# Patient Record
Sex: Female | Born: 1955 | Hispanic: No | Marital: Married | Smoking: Former smoker
Health system: Southern US, Community
[De-identification: ages and names within clinical notes are randomized; demographics above are authoritative.]

## PROBLEM LIST (undated history)

## (undated) DIAGNOSIS — I10 Essential (primary) hypertension: Secondary | ICD-10-CM

## (undated) DIAGNOSIS — I2510 Atherosclerotic heart disease of native coronary artery without angina pectoris: Secondary | ICD-10-CM

---

## 2002-11-27 ENCOUNTER — Encounter: Payer: Self-pay | Admitting: Emergency Medicine

## 2002-11-27 ENCOUNTER — Emergency Department (HOSPITAL_COMMUNITY): Admission: EM | Admit: 2002-11-27 | Discharge: 2002-11-27 | Payer: Self-pay | Admitting: Emergency Medicine

## 2004-10-20 ENCOUNTER — Emergency Department (HOSPITAL_COMMUNITY): Admission: EM | Admit: 2004-10-20 | Discharge: 2004-10-20 | Payer: Self-pay | Admitting: Emergency Medicine

## 2004-10-27 ENCOUNTER — Ambulatory Visit (HOSPITAL_COMMUNITY): Admission: RE | Admit: 2004-10-27 | Discharge: 2004-10-27 | Payer: Self-pay | Admitting: Urology

## 2004-11-06 ENCOUNTER — Ambulatory Visit (HOSPITAL_COMMUNITY): Admission: RE | Admit: 2004-11-06 | Discharge: 2004-11-06 | Payer: Self-pay | Admitting: Urology

## 2004-11-15 ENCOUNTER — Emergency Department (HOSPITAL_COMMUNITY): Admission: EM | Admit: 2004-11-15 | Discharge: 2004-11-15 | Payer: Self-pay | Admitting: Emergency Medicine

## 2008-10-16 ENCOUNTER — Emergency Department (HOSPITAL_COMMUNITY): Admission: EM | Admit: 2008-10-16 | Discharge: 2008-10-17 | Payer: Self-pay | Admitting: Emergency Medicine

## 2009-03-04 ENCOUNTER — Ambulatory Visit: Payer: Self-pay | Admitting: Cardiology

## 2009-03-04 ENCOUNTER — Inpatient Hospital Stay (HOSPITAL_COMMUNITY): Admission: EM | Admit: 2009-03-04 | Discharge: 2009-03-06 | Payer: Self-pay | Admitting: Emergency Medicine

## 2009-03-20 DIAGNOSIS — N200 Calculus of kidney: Secondary | ICD-10-CM | POA: Insufficient documentation

## 2009-03-20 DIAGNOSIS — N259 Disorder resulting from impaired renal tubular function, unspecified: Secondary | ICD-10-CM | POA: Insufficient documentation

## 2009-03-20 DIAGNOSIS — I10 Essential (primary) hypertension: Secondary | ICD-10-CM | POA: Insufficient documentation

## 2009-03-20 DIAGNOSIS — E119 Type 2 diabetes mellitus without complications: Secondary | ICD-10-CM

## 2009-03-20 DIAGNOSIS — E785 Hyperlipidemia, unspecified: Secondary | ICD-10-CM

## 2009-03-20 DIAGNOSIS — M542 Cervicalgia: Secondary | ICD-10-CM

## 2009-03-20 DIAGNOSIS — K644 Residual hemorrhoidal skin tags: Secondary | ICD-10-CM | POA: Insufficient documentation

## 2009-03-20 DIAGNOSIS — I2510 Atherosclerotic heart disease of native coronary artery without angina pectoris: Secondary | ICD-10-CM | POA: Insufficient documentation

## 2009-03-25 ENCOUNTER — Ambulatory Visit: Payer: Self-pay | Admitting: Cardiology

## 2009-03-25 ENCOUNTER — Encounter: Payer: Self-pay | Admitting: Cardiology

## 2009-04-10 ENCOUNTER — Ambulatory Visit: Payer: Self-pay | Admitting: Pulmonary Disease

## 2009-04-10 DIAGNOSIS — R05 Cough: Secondary | ICD-10-CM

## 2009-04-10 DIAGNOSIS — R0602 Shortness of breath: Secondary | ICD-10-CM | POA: Insufficient documentation

## 2009-04-12 ENCOUNTER — Telehealth (INDEPENDENT_AMBULATORY_CARE_PROVIDER_SITE_OTHER): Payer: Self-pay | Admitting: *Deleted

## 2009-04-17 DIAGNOSIS — J984 Other disorders of lung: Secondary | ICD-10-CM

## 2009-04-19 ENCOUNTER — Ambulatory Visit: Payer: Self-pay | Admitting: Cardiology

## 2011-02-26 IMAGING — CR DG CHEST 1V PORT
1 series · 1 of 1 positions shown · non-contrast
Comparison: 11/03/2004

CLINICAL DATA: Chest pain

PORTABLE CHEST - 1 VIEW

[view not recorded]
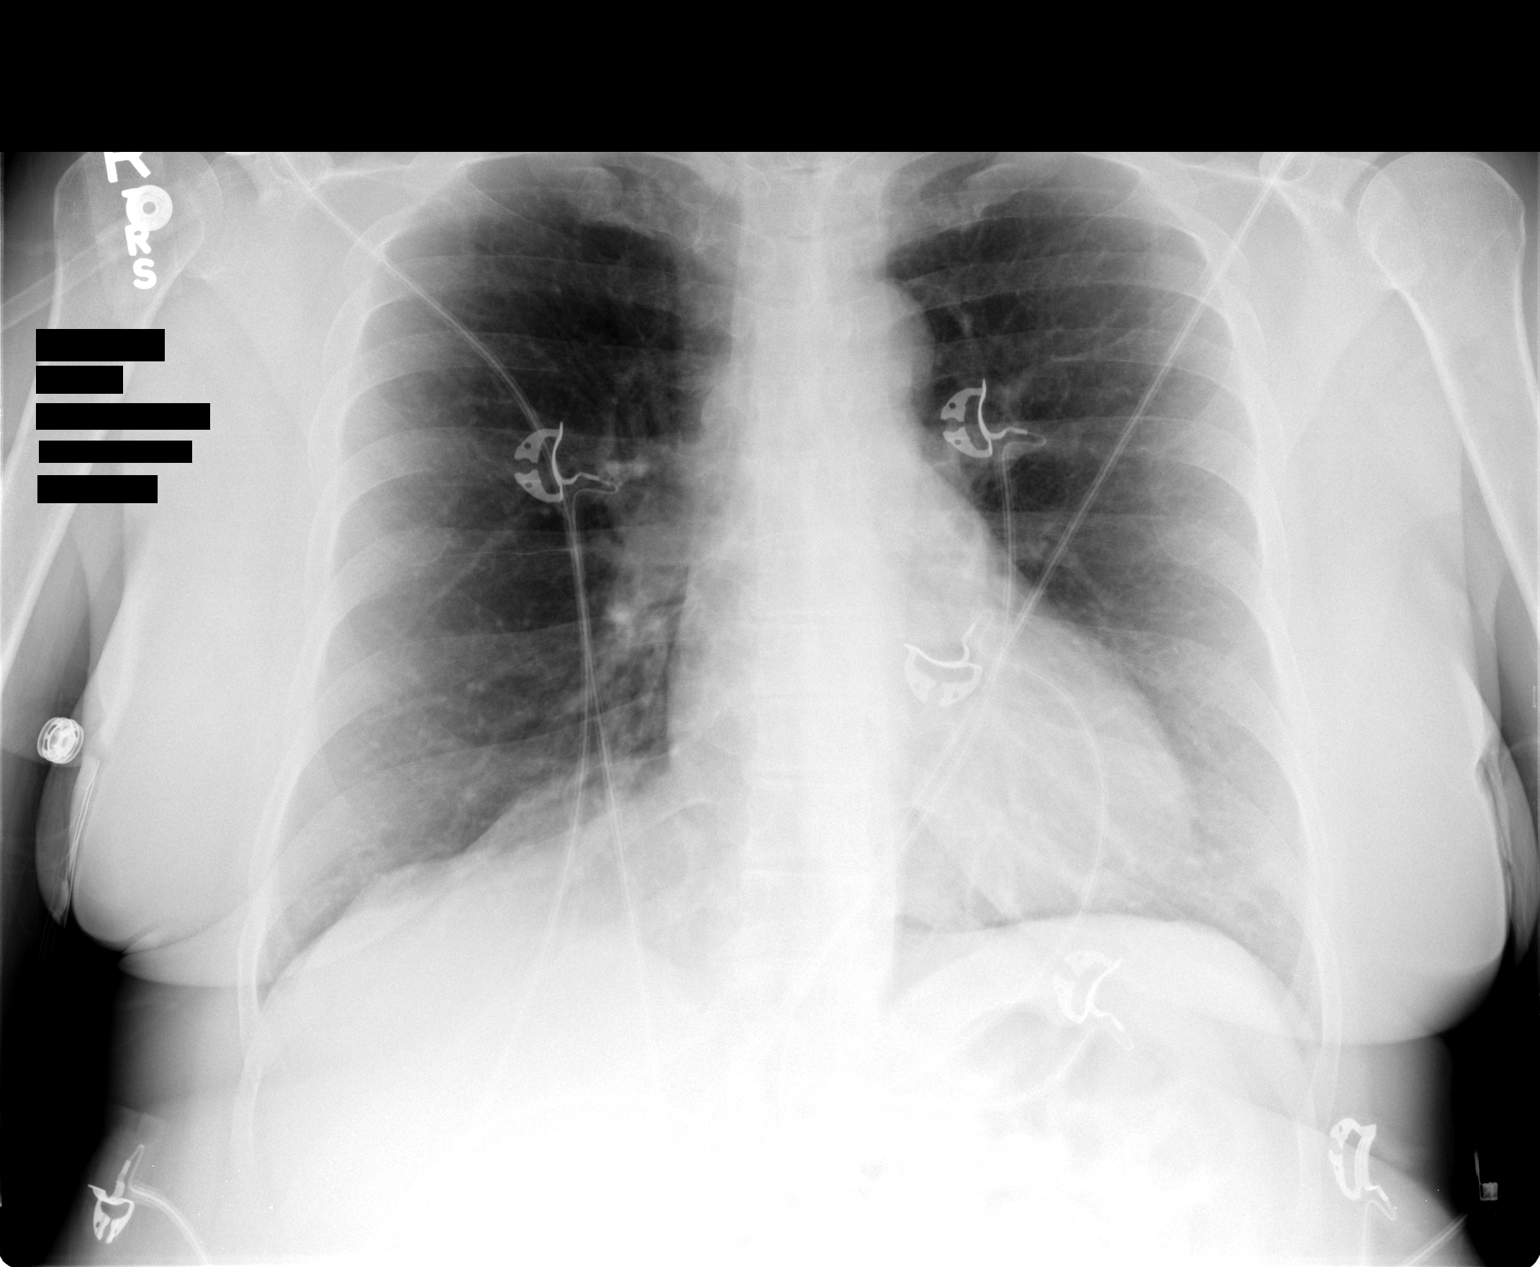

[1 of 1 positions shown; findings below may reference images not displayed]

FINDINGS: Heart and mediastinal contours normal.  Lungs clear.
Osseous structures intact in one-view.
IMPRESSION: No acute or significant findings.

## 2011-04-02 LAB — COMPREHENSIVE METABOLIC PANEL
Albumin: 3.5 g/dL (ref 3.5–5.2)
BUN: 19 mg/dL (ref 6–23)
Chloride: 103 mEq/L (ref 96–112)
Creatinine, Ser: 0.94 mg/dL (ref 0.4–1.2)
Glucose, Bld: 97 mg/dL (ref 70–99)
Total Bilirubin: 0.5 mg/dL (ref 0.3–1.2)

## 2011-04-02 LAB — DIFFERENTIAL
Basophils Absolute: 0 10*3/uL (ref 0.0–0.1)
Eosinophils Relative: 0 % (ref 0–5)
Lymphocytes Relative: 10 % — ABNORMAL LOW (ref 12–46)
Monocytes Absolute: 0.6 10*3/uL (ref 0.1–1.0)
Monocytes Relative: 3 % (ref 3–12)
Neutro Abs: 15.2 10*3/uL — ABNORMAL HIGH (ref 1.7–7.7)

## 2011-04-02 LAB — CBC
HCT: 42.2 % (ref 36.0–46.0)
HCT: 46.5 % — ABNORMAL HIGH (ref 36.0–46.0)
Hemoglobin: 15.3 g/dL — ABNORMAL HIGH (ref 12.0–15.0)
MCHC: 33 g/dL (ref 30.0–36.0)
MCV: 90.4 fL (ref 78.0–100.0)
RBC: 4.66 MIL/uL (ref 3.87–5.11)
RBC: 5.11 MIL/uL (ref 3.87–5.11)
RDW: 15.9 % — ABNORMAL HIGH (ref 11.5–15.5)
WBC: 13 10*3/uL — ABNORMAL HIGH (ref 4.0–10.5)

## 2011-04-02 LAB — CARDIAC PANEL(CRET KIN+CKTOT+MB+TROPI)
CK, MB: 2.7 ng/mL (ref 0.3–4.0)
Relative Index: INVALID (ref 0.0–2.5)
Total CK: 26 U/L (ref 7–177)
Total CK: 34 U/L (ref 7–177)
Troponin I: 0.17 ng/mL — ABNORMAL HIGH (ref 0.00–0.06)
Troponin I: 0.21 ng/mL — ABNORMAL HIGH (ref 0.00–0.06)

## 2011-04-02 LAB — PROTIME-INR
INR: 1 (ref 0.00–1.49)
Prothrombin Time: 13.7 seconds (ref 11.6–15.2)

## 2011-04-02 LAB — BASIC METABOLIC PANEL
CO2: 28 mEq/L (ref 19–32)
Calcium: 9.5 mg/dL (ref 8.4–10.5)
Chloride: 108 mEq/L (ref 96–112)
GFR calc Af Amer: >60 mL/min (ref >60)
GFR calc Af Amer: >60 mL/min (ref >60)
GFR calc non Af Amer: >60 mL/min (ref >60)
GFR calc non Af Amer: >60 mL/min (ref >60)
Glucose, Bld: 101 mg/dL — ABNORMAL HIGH (ref 70–99)
Potassium: 3.8 mEq/L (ref 3.5–5.1)
Potassium: 4.2 mEq/L (ref 3.5–5.1)
Sodium: 139 mEq/L (ref 135–145)
Sodium: 138 mEq/L (ref 135–145)

## 2011-04-02 LAB — GLUCOSE, CAPILLARY
Glucose-Capillary: 102 mg/dL — ABNORMAL HIGH (ref 70–99)
Glucose-Capillary: 108 mg/dL — ABNORMAL HIGH (ref 70–99)
Glucose-Capillary: 165 mg/dL — ABNORMAL HIGH (ref 70–99)
Glucose-Capillary: 89 mg/dL (ref 70–99)
Glucose-Capillary: 95 mg/dL (ref 70–99)

## 2011-04-02 LAB — POCT CARDIAC MARKERS
CKMB, poc: 2.9 ng/mL (ref 1.0–8.0)
Troponin i, poc: <0.05 ng/mL (ref 0.00–0.09)

## 2011-04-02 LAB — TSH: TSH: 1.766 u[IU]/mL (ref 0.350–4.500)

## 2011-04-02 LAB — CK TOTAL AND CKMB (NOT AT ARMC)
CK, MB: 3.3 ng/mL (ref 0.3–4.0)
Relative Index: INVALID (ref 0.0–2.5)
Total CK: 51 U/L (ref 7–177)

## 2011-04-02 LAB — LIPID PANEL: Triglycerides: 335 mg/dL — ABNORMAL HIGH (ref <150)

## 2011-05-05 NOTE — Discharge Summary (Signed)
Melanie Jackson, WROBLEWSKI NO.:  [id number]   MEDICAL RECORD NO.:  [id number]          PATIENT TYPE:  INP   LOCATION:  2506                         FACILITY:  [facility]   PHYSICIAN:  Luis Abed, MD, FACCDATE OF BIRTH:  June 23, 1956   DATE OF ADMISSION:  03/04/2009  DATE OF DISCHARGE:  03/06/2009                               DISCHARGE SUMMARY   PRIMARY CARDIOLOGIST:  Luis Abed, MD, [facility]   PRIMARY MEDICAL DOCTOR:  Located at [facility].   DISCHARGE DIAGNOSES:  Severe coronary artery disease (severe single  vessel disease and status post non-ST-elevation myocardial infarction,  status post successful percutaneous coronary intervention to ramus with  drug-eluting stent).   SECONDARY DIAGNOSES:  1. Tobacco abuse disorder (ongoing up to admission).  2. Diabetes mellitus.  3. Hypertension.  4. Hyperlipidemia.  5. History of nephrolithiasis.  6. History of chronic neck pain.  7. History of renal insufficiency.   ALLERGIES:  MORPHINE (causes hives).   PROCEDURES PERFORMED DURING THIS HOSPITALIZATION:  EKG.  The patient had  EKG performed on March 04, 2009 at [facility] that showed sinus  bradycardia at a rate of 59 beats per minute.  T-wave inversion in lead  V2, V4 through V6, as well as lead I and aVL.  No significant Q-waves.  No evidence of hypertrophy.  The patient had another EKG on February 04, 2009 in the [facility] that showed sinus  bradycardia with a rate of 59 beats per minute.  T-wave inversion in V2,  lead I, and aVL.  Some T-wave flattening in V3.  Otherwise, no acute ST-  T wave changes.  No significant Q-waves, normal axis.  No evidence of  hypertrophy.  No QTc prolongation in either EKG.  Chest x-ray performed  on March 04, 2009 showed no acute significant findings.  CT angio of  chest March 04, 2009.  1. No pulmonary emboli.  2. No active pulmonary disease.  (There is a 3-mm left upper lobe      nodule).  3. No other  acute or significant findings.   The patient had EKG on March 05, 2006, showed normal sinus rhythm with a  rate of 55 beats per minute, possible left atrial enlargement, normal  axis, no evidence of hypertrophy, minimal T-wave flattening in V6, T-  wave inversion in lead I and aVL.  The patient had EKG on March 05, 2009, 12 hours after prior EKG showed some nonspecific ST-T wave changes  and anterolateral leads, otherwise no significant findings compared to  prior EKG and then the patient had EKG on March 06, 2009 showed normal  sinus rhythm with a rate of 61 beats per minute.  No significant  differences from prior EKGs.   The patient had a cardiac catheterization on March 05, 2009 that showed;  1. Severe single vessel coronary artery disease at the ramus      intermedius.  2. Nonobstructive stenosis of the left anterior descending, left      circumflex, and right coronary artery.  3. Successful percutaneous coronary intervention to the ramus  intermedius.  4. Mild segmental left ventricular dysfunction.   HISTORY OF PRESENT ILLNESS:  Ms. Baranski is a 55 year old Caucasian  female with a history (described above) along with significant increase  in stress recently prior to admission, presenting with 3 days of  intermittent pain in the axilla bilaterally radiating down her side into  her chest.  The patient reports the pain has 10/10 in severity at its  worst and has been associated with diaphoresis as well as upper  extremity weakness (right upper extremity greater than left).  The  patient reports chronic shortness of breath/dyspnea on exertion  secondary to smoking and recent (last 3 months) chronic bronchitis.  The  patient reports shortness of breath recently difficult to distinguish  from baseline symptoms.  The patient denies orthopnea, PND, and lower  extremity edema.  The patient reports her symptoms initially beginning  the morning of March 01, 2009, resolving after 3  hours and did not  repeat until Sunday morning.  At that time, there was significantly  worsened and associated with diaphoresis for the first time.  The  patient had some lesser OxyContin from prior kidney infection palpated  by echo that relieved pain somewhat.  The patient continued to have  symptoms intermittently on day of admission and reported [facility] for  evaluation and EKG was completed at [facility] that showed possible  ischemia (T-wave inversion) and the patient was called and instructed to  report ED for evaluation.  At the time of eval in the ED, the patient  was asymptomatic.   HOSPITAL COURSE:  The patient was admitted above with stable vital  signs.  She remained asymptomatic on March 05, 2009; however, her  cardiac enzymes were up trending from less than 0.05-0.18 and then to  0.21.  The patient was also noted to have a significant hyperlipidemia  with total cholesterol of 213, triglycerides 335, HDL 42, LDL 104, and  VLDL 67.  The patient was also noted to have 3-mm nodule on CT angio  completed on March 04, 2009 and due to the patient's significant smoking  history, has been instructed to have a followup CT angio of the chest in  1 year per Radiology recommendation.  Due to elevated cardiac enzymes as  well as the patient's multiple risk factors and lack of alternative  explanation, the patient was scheduled for diagnostic cardiac  catheterization on March 05, 2009, (see results above).  The patient  tolerated the procedure well without significant complications.  The  patient will receive aggressive medical management going forward  including full-strength aspirin and Plavix 75 mg p.o. daily.  The  patient will also continue with simvastatin that was initiated inpatient  as 20 mg p.o. daily and will be titrated as tolerated at followup  visits.  Note, the patient will began metoprolol 12.5 mg p.o. b.i.d. due  to NSTEMI and poorly, moderately controlled  hypertension.  The patient's  enalapril dose which was uncertain on admission will be 10 mg p.o.  daily, moving forward and beta-blocker and ACE inhibitor will be  titrated as needed at followup visit.  The patient reports her insurance  is not covering Plavix or Chantix, which she will need for smoking  cessation per smoking cessation counselor recommendations.  Therefore,  case management was consulted in both Plavix and Chantix.  Assistance  paperwork has been completed.   At the time of discharge the patient's followup instructions, postcath  instructions, new medication list were given to her  both in oral and  written form and when last seen, the patient has no questions or  concerns that had not been addressed.   Most recent vital signs on discharge were temperature 97.8 degrees  Fahrenheit, BP 137/91, pulse 77, respiration rate 15, O2 saturation 98%  on room air.   DISCHARGE LABS:  WBC 13.0, HGB 14.1, HCT 42.2, PLT count 183.  Sodium  139, potassium 3.8, chloride 108, CO2 26, BUN 19, creatinine 0.80,  glucose 75, calcium 8.8.  Lipid studies, see hospital course.  Last  troponin down trending at 0.17 from 0.21, total bilirubin 0.5, alkaline  phosphatase 77, AST 14, ALT 21, total protein 5.7, albumin 3.5.  TSH  1.766.   FOLLOWUP PLANS AND APPOINTMENTS:  The patient has followup appointment  at [facility] for a lab draw on March 21, 2009.  Time of day  prior to close at 5 p.m.  The patient has a followup appointment with  primary cardiologist, Dr. Myrtis Ser on March 25, 2009 at 3 p.m.   DISCHARGE MEDICATIONS:  1. Trazodone 200 mg p.o. nightly.  2. Metformin 500 mg p.o. b.i.d. (to be restarted on March 07, 2009 in      the a.m.)  3. Enalapril 10 mg p.o. daily.  4. Black cohosh 2 tablets p.o. daily.  5. Vitamin C 2 tablets p.o. daily.  6. Cranberry pills 2 tabs p.o. daily.  7. Bone health supplement 2 tablets p.o. daily.  8. Metoprolol 12.5 mg p.o. b.i.d.  9. Enteric-coated  aspirin 325 mg p.o. daily.  10.Plavix 75 mg p.o. daily.  11.Chantix, take as directed.  12.Simvastatin 20 mg p.o. daily.  13.Nitroglycerin 0.4 mg sublingual p.r.n. chest pain.   Duration of discharge encounter including physician time was 45 minutes.      Jarrett Ables, [facility]      Luis Abed, MD, [facility]  Electronically Signed    MS/MEDQ  D:  03/06/2009  T:  03/07/2009  Job:  [id number]   cc:   Veverly Fells. Excell Seltzer, MD

## 2011-05-05 NOTE — Cardiovascular Report (Signed)
NAMEARTHELIA, CALLICOTT                ACCOUNT NO.:  [id number]   MEDICAL RECORD NO.:  [id number]          PATIENT TYPE:  INP   LOCATION:  2506                         FACILITY:  [facility]   PHYSICIAN:  Veverly Fells. Excell Seltzer, MD  DATE OF BIRTH:  01-03-1956   DATE OF PROCEDURE:  03/05/2009  DATE OF DISCHARGE:                            CARDIAC CATHETERIZATION   PROCEDURE:  Left heart catheterization, selective coronary angiography,  left ventricular angiography, percutaneous transluminal coronary  angioplasty and stenting of the ramus intermedius and Perclose of the  right femoral artery.   INDICATIONS:  Ms. Melanie Jackson is a 55 year old woman with multiple cardiac  risk factors who presented with an acute coronary syndrome.  She had  positive cardiac enzymes and ruled in for a non-ST-elevation MI.  She  was referred for cardiac cath.   Risks and indications of procedure were reviewed with the patient.  Informed consent was obtained.  The right groin was prepped, draped, and  anesthetized with 1% lidocaine.  Using modified Seldinger technique, a 5-  French sheath was placed in the right femoral artery.  The standard 5-  J[place]ica Judkins catheters were used for coronary angiography and left  ventriculography.  Following the diagnostic procedure, I elected to  intervene on a critical stenosis in the ramus intermedius.  The sheath  was up-sized to a 6-French.  Angiomax was used for anticoagulation.  The  patient had been preloaded with 300 mg of Plavix earlier in the day.  A  6-French XB 3.5-cm guide catheter was used.  The ramus was wired with a  cougar guidewire without difficulty.  The vessel was predilated with a  2.0 x 6 mm Sprinter balloon which was taken to 6 atmospheres on multiple  inflations.  I elected to treat the vessel with a 2.5 x 14 mm Endeavor  drug-eluting stent which was carefully positioned and deployed at 9  atmospheres.  The stent appeared well expanded.  The vessel increased  markedly in size after stenting.  It was clearly underfilled prior to  stenting.  The stent was then postdilated with a 2.5 x 12 mm Sprinter [place]  balloon which was taken to 12 atmospheres and then 16 atmospheres.  At  the completion of the procedure, there was a well-expanded stent with a  step-up into the stent and a step-down off the distal end.  There was  TIMI III flow in the vessel and an excellent angiographic result.  Femoral angiography demonstrated a high stick just above the inferior  epigastric artery.  I elected to Perclose the vessel because I thought  that was the safest method of hemostasis.   FINDINGS:  Aortic pressure 148/83 with a mean of 107, left ventricular  pressure 149/7.   CORONARY ANGIOGRAPHY:  Left mainstem:  Widely patent.  No significant  stenosis.   LAD:  The LAD is a moderate-sized vessel that courses down and reaches  the LV apex.  The LAD has mild diffuse stenosis throughout the  midportion up to 30%.  There are 3 very small diagonal branches and 2  small septal perforator branches.  The ramus intermedius is a moderate-sized vessel.  There is a 95%  stenosis in the proximal portion of the vessel.  This stenosis occurs  just beyond the branch point of the vessel, but effects the dominant  branch of the intermediate.  The remaining portions of the intermediate  branch are relatively small with no significant stenosis.   Left circumflex:  The left circumflex is widely patent.  There is mild  nonobstructive stenosis in the midportion.  The circumflex is codominant  with the right coronary artery.   RCA:  The RCA is small.  It is diffusely diseased, but only  nonobstructive stenosis is present.  In the midportion, there is 30-40%  stenosis.  It supplies a small PDA and a small posterolateral branch.   Left ventriculography shows an akinetic segment in a focal region of the  anterolateral wall.  The apex contracts normally.  The inferior wall  also  contracts normally.  The estimated LVEF is 45%.  There is no  significant mitral regurgitation.   ASSESSMENT:  1. Severe single-vessel coronary artery disease of the ramus      intermedius.  2. Nonobstructive stenosis of the left anterior descending, left      circumflex, and right coronary artery.  3. Successful percutaneous coronary intervention to the ramus      intermedius.  4. Mild segmental left ventricular dysfunction.   PLAN:  Recommend dual-antiplatelet therapy with aspirin and Plavix  greater than or equal to 12 months.  Aggressive medical therapy  following the patient's acute coronary syndrome.      Veverly Fells. Excell Seltzer, MD  Electronically Signed     MDC/MEDQ  D:  03/05/2009  T:  03/06/2009  Job:  [id number]   cc:   Luis Abed, MD, [facility]

## 2011-05-05 NOTE — H&P (Signed)
NAMEELVERNA, CAFFEE NO.:  [id number]   MEDICAL RECORD NO.:  [id number]          PATIENT TYPE:  INP   LOCATION:  3709                         FACILITY:  [facility]   PHYSICIAN:  Luis Abed, MD, FACCDATE OF BIRTH:  1956-01-19   DATE OF ADMISSION:  03/04/2009  DATE OF DISCHARGE:                              HISTORY & PHYSICAL   PRIMARY CARE Enriqueta Augusta:  [facility].   The patient is new to Cardiology; going forward, her cardiologist will  be Dr. Myrtis Ser.   CHIEF COMPLAINT:  Chest pain/EKG changes.   HISTORY OF PRESENT ILLNESS:  This is a 55 year old female with a history  of hypertension, hyperlipidemia, diabetes mellitus type 2, ongoing  tobacco disorder, and recent significant increase in stress, presenting  with 3 days of intermittent pain in the axilla bilaterally, radiating  down her sides and into her chest.  The patient reports the pain is  10/10 in severity at its worse and has been associated with diaphoresis  and upper extremity weakness, right greater than left during certain  episode of symptoms.  The patient reports chronic shortness of breath  secondary to smoking and chronic bronchitis.  The patient has been short  of breath at that time with the symptoms, however, difficult to  determine if a change from baseline.  The patient also reports chronic  dyspnea on exertion with no change recently.  Denies orthopnea, PND, and  lower extremity edema.  The patient states that her symptoms initially  began on Friday morning, which would be March 01, 2009, resolved after 3  hours, and did not repeat until Sunday morning at which time, they were  significantly worsened and associated with diaphoresis.  The pain was  relieved somewhat with OxyContin that the patient had left over from a  kidney infection she had several months ago.  The patient continued to  have symptoms today and reported to [facility] for evaluation.  The  patient had an EKG that showed some  T-wave inversion that was analyzed  after she had left and she was called and told to present to the  emergency department for evaluation.  At the time of her eval in the ED,  the patient was asymptomatic.   PAST MEDICAL HISTORY:  1. Nephrolithiasis.  2. History of renal insufficiency.  3. Tobacco abuse disorder (ongoing).  4. Chronic neck pain.  5. Diabetes mellitus.  6. Hypertension.  7. Hyperlipidemia.  8. External hemorrhoids.   SOCIAL HISTORY:  The patient lives in [place] with her husband.  She  works part-time, Education officer, environmental houses.  She has a 60 pack-year smoking  history that is ongoing.  She does not drink any alcohol nor does she  take any illicit drugs.  She takes black cohosh, vitamin C, and  cranberry pills as herbal medications.  She has a regular diet and does  no regular exercise.   FAMILY HISTORY:  The patient was raised by her grandmother due to both  her parents dying, when she was extremely young, in a motor vehicle  accident.  She does not know of their  medical history.  Her grandmother  died at age 37 with coronary artery disease and diabetes mellitus.  She  has one brother, who is living, with no history of coronary artery  disease.   REVIEW OF SYSTEMS:  As described in HPI.  Also chronic cough, dyspnea on  exertion, amenorrhea for the last 4 years (the patient is  postmenopausal, although she reports recent hot flashes over the last  several days occasionally associated with symptomatology described in  HPI).  She has had significant increase in stress over the last several  weeks.  She has bright red blood per rectum occasionally due to  hemorrhoids.  Otherwise, all other systems were reviewed and were  negative.   ALLERGIES:  MORPHINE (causes hives and itching).   MEDICATIONS:  1. Metformin 500 mg p.o. b.i.d.  2. Enalapril question dose.   PHYSICAL EXAMINATION:  VITAL SIGNS:  Temp is 98 degrees Fahrenheit, BP  145/81, pulse 66, respiration rate  17, O2 saturation was 97% on 2 L by  nasal cannula.  GENERAL:  On exam, the patient was alert and oriented x3 in no apparent  distress and able to speak in full sentences, as well as moved easily  without respiratory distress.  HEENT:  Head was normocephalic, atraumatic.  Pupils equal, round, and  reactive to light.  Extraocular muscles were intact.  The nares were  patent without discharge.  Dentition was fair.  Oropharynx was without  erythema or exudates.  NECK:  Supple without lymphadenopathy.  No JVD.  No thyromegaly.  No  bruits.  HEART:  Regular with audible S1, S2.  No clicks, rubs, murmurs, or  gallops.  Pulses were 2+ and equal in both upper and lower extremities  bilaterally.  LUNGS:  Lungs were clear to auscultation bilaterally.  Decreased breath  sounds at the bases.  SKIN:  No rashes, lesions, or petechiae.  ABDOMEN:  Soft, nontender, and nondistended.  Normal abdominal bowel  sounds.  No rebound or guarding.  No hepatosplenomegaly and no  pulsations.  The patient is overweight.  EXTREMITIES:  No clubbing, cyanosis, or edema.  MUSCULOSKELETAL:  Reveals no joint deformity or effusions.  No spinal or  CVA tenderness.  NEURO:  Cranial nerves II-XII are grossly intact.  Strength is 5/5 in  all extremities and axial groups.  Normal sensation throughout and  normal cerebellar function.   RADIOLOGY:  The patient had chest x-ray that showed no acute or  significant findings.  The patient had a CT of the chest, which showed  no evidence of pulmonary embolus.  It showed a tiny lung nodule.  No  need for followup unless the patient is high risk for lung cancer, in  which case followup in 1 year.  The patient had an EKG in the ED that  showed sinus bradycardia with a rate of 59 beats per minute; T-wave  inversion in V2, 1, and aVL; T-wave flattening in V3; otherwise, no  acute ST-T wave changes.  No significant Q wave and normal axis, no  evidence of hypertrophy.  PR 132, QRS 88,  QTc 428.  The patient had an  EKG earlier in the day that showed sinus bradycardia with a rate of 59  beats per minute.  It shows T-wave inversion in lead V2 and V4 through  V6 as well as in 1 and aVL.  No significant Q-wave.  No evidence of  hypertrophy, normal axis, PR 106, QRS 90, and QTc 457.   LABORATORY DATA:  WBC 17.7, HGB 15.3, HCT 46.55, PLT count 228.  Sodium  138, potassium 4.2, chloride 104, CO2 28, BUN 19, creatinine 0.90,  glucose 101.  First set of cardiac enzymes were negative.   ASSESSMENT AND PLAN:  This is a 55 year old female with a history of  diabetes mellitus, hypertension, hyperlipidemia, ongoing tobacco abuse  disorder, and a recent increase in stress presenting with atypical chest  pain.  1. Atypical chest pain.  There is little in the way of concrete      objective data other than some transient EKG changes that may or      may not be significant.  However, the patient has multiple risk      factors including a coronary artery disease equivalent (diabetes      mellitus), was admitted, rule out with cycle cardiac enzymes x3,      completed inpatient stress Myoview on treadmill or cardiac      catheterization if her enzymes were positive.  We will check her      lipids in the a.m., check a CMET in the a.m., check a CBC in the      a.m., and check an EKG in the a.m.  She will be started on a low-      dose beta-blocker, metoprolol 12.5 mg p.o. b.i.d. as well as low-      dose statin, simvastatin 20 mg p.o. daily.  She will also take      aspirin 325 mg p.o. daily and continue on her enalapril.  We will      start her at a low dose as we are unsure of her dose at 5 mg p.o.      daily.  At this time, we will not give her heparin and deep venous      thrombosis prophylaxis will be SCDs.  2. Hypertension.  See plan in number 1 for addition of beta-blocker      and continued home medications.  3. Hyperlipidemia.  See plan number 1.  Check lipids.  Start statin.  4.  Deep venous thrombosis prophylaxis, SCDs.  5. Tiny lung nodule.  We will inform the patient that due to her high      risk for lung cancer, she should have repeat CT of the chest in 1      year.      Jarrett Ables, [facility]      Luis Abed, MD, [facility]  Electronically Signed    MS/MEDQ  D:  03/04/2009  T:  03/05/2009  Job:  [id number]

## 2011-05-05 NOTE — Assessment & Plan Note (Signed)
[facility]                            CARDIOLOGY OFFICE NOTE   NAME:Kriz, OMUNIQUE PEDERSON                       MRN:          [id number]  DATE:03/25/2009                            DOB:          04-07-1956    HISTORY OF PRESENT ILLNESS:  Melanie Jackson is seen for Cardiology follow-  up today.  A copy of her history and physical and discharge summary from  [facility] will accompany these records to Dr. Gaynelle Adu.  The  admission was on March 04, 2009, and the discharge was on March 06, 2009.   The patient presented with some discomfort in her chest.  She had T-wave  inversions, and she was referred to the hospital.  We were quite  concerned about her status and decision was made to proceed with cardiac  catheterization.  This was done.  The patient had a tight lesion in her  ramus.  This was treated with a drug-eluting stent.  She felt much  better.  She had mild left ventricular dysfunction.   The patient was discharged.  Plans were made for careful follow-up.  It  had been noted in the hospital that she had a 3 mm lung nodule in the  left upper lobe.  A formal recommendation was for follow-up CT of the  chest in one year.   The patient returns today post hospitalization.  She has what sounds  like an upper respiratory infection.  She is not febrile.  She says that  her ear is stopped up.  She felt that this may be due to some of the  medications that were started.  She stopped her simvastatin and  metoprolol.  In addition, she stopped her Plavix.  I immediately urged  her to go back on the Plavix as she has a drug-eluting stent in place.   PAST MEDICAL HISTORY:   ALLERGIES:  MORPHINE.   MEDICATIONS:  At this time, she has a long medication list, but it is  not clear to me which of these medicines.  She in fact is taking.  She  did tell me that she stopped her metoprolol, simvastatin and Plavix.   OTHER MEDICAL PROBLEMS:  See the list  below.   REVIEW OF SYSTEMS:  Today, she has a cough.  She has had some difficulty  with her ear.  She is not having chest pain.  There are no skin rashes.  She has not had any visual changes.  She does have a cough.  She has no  GI or GU symptoms.  There are no major musculoskeletal problems.   PHYSICAL EXAMINATION:  VITAL SIGNS:  Blood pressure is 110/70.  Pulse is  68.  GENERAL:  The patient is here with a friend.  The patient is oriented to  person, time and place.  Affect is normal.  HEENT:  Reveals no xanthelasma.  She has cotton in her right ear.  I did  not examine her ear, and I explained to her that this is not part of my  cardiac evaluation.  I urged her to go  back to be seen in primary care  for her general medical problems.  LUNGS:  Reveal some scattered rhonchi.  CARDIAC:  Exam reveals S1-S2.  There are no clicks or significant  murmurs.  ABDOMEN:  Soft.  She has no significant peripheral edema.   STUDIES:  EKG today revealed reveals some nonspecific ST-T wave changes  with some lateral T-wave inversions.   PROBLEMS:  1. Recent non-ST elevation MI.  2. 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  3. Cigarette abuse.  She says she has not smoked in 3 weeks.  4. Hypertension, stable.  5  History of renal stones.  1. History of renal insufficiency, although her renal function was      quite good in the hospital.  2. A 3 mm lung nodule in the left upper lobe of her lung seen by chest      CT.  Recommendation is for her to have this followed up in one      year.  The patient is completely aware that this needs to be      followed in one year.  She says that she will have this followed by      her doctors in [place].  3. Probable upper respiratory infection with possible sinus infection.      I have urged the patient to be seen again by primary care to obtain      the treatment for these problems.   It is extremely  important that the patient restart to take her aspirin  and Plavix again.  She thinks that this may be playing a role in her  nasal and lung symptoms.  I do not think so.  I explained to her and her  friend that it is dangerous for her to be off Plavix in this setting  having recently received a drug-eluting stent.     Luis Abed, MD, [facility]  Electronically Signed    JDK/MedQ  DD: 03/25/2009  DT: 03/25/2009  Job #: [id number]   cc:   Dr. Rosalio Macadamia

## 2011-05-08 NOTE — Op Note (Signed)
NAMEMARLYCE, Melanie Jackson                ACCOUNT NO.:  [id number]   MEDICAL RECORD NO.:  [id number]          PATIENT TYPE:  AMB   LOCATION:  DAY                          FACILITY:  [facility]   PHYSICIAN:  Sigmund I. Patsi Sears, M.D.DATE OF BIRTH:  January 08, 1956   DATE OF PROCEDURE:  10/27/2004  DATE OF DISCHARGE:                                 OPERATIVE REPORT   PREOPERATIVE DIAGNOSIS:  Left ureteropelvic junction stone.   POSTOPERATIVE DIAGNOSIS:  Left ureteropelvic junction stone.   OPERATION:  1.  Cystourethroscopy.  2.  Left retrograde pyelogram.  3.  Left double-J catheter.   SURGEON:  Sigmund I. Patsi Sears, M.D.   ANESTHESIA:  General LMA.   PREPARATION:  After appropriate preanesthesia, the patient is brought to the  operating room and placed on the operating table in the dorsal supine  position where general LMA anesthesia was introduced.  The patient was then  re-placed in dorsal lithotomy position where the pubis was prepped with  Betadine solution and draped in usual fashion.   HISTORY:  Melanie Jackson is a 55 year old female, recently moved from [place],  with known left UPJ stone.  She presented to [facility] Emergency Room with  a left renal colic, with CT scan showing 10 mm left UPJ stone.  She was  counseled about lithotripsy and presents now for left cystoscopy and left  retrograde pyelogram to rule out UPJ obstruction and double-J catheter  placement.  She is a known type 2 diabetic and is well controlled and also  has a blue mark on her left flank placed preoperatively by myself.   DESCRIPTION OF PROCEDURE:  Following general anesthesia, the patient was  placed in dorsal lithotomy position where the pubis was prepped with  Betadine solution and draped in usual fashion.   Cystourethroscopy revealed normal-appearing bladder with no evidence of  bladder stone, tumor, or diverticular formation.  Left retrograde pyelogram  showed a normal-appearing ureter.  An abnormal  area was found at the UPJ,  but I could not definitely see stone material on the fluoroscopic unit.  Retrograde pyelogram was performed, and there appeared to be an abnormal  area in the UPJ.  But again, no obvious stone could identified.  A guidewire  was placed and had great difficulty passing through the granular material.  The ureteral stent was placed, and I could feel  granular stone material at the UPJ.  Eventually, a Glidewire was passed into  the renal pelvis and coiled.  Over this, a 6 French x 24 cm double-J  catheter was passed in the renal pelvis and the bladder.  The patient  tolerated the procedure well, was given IV Toradol, awakened, and taken to  recovery room in good condition.      SIT/MEDQ  D:  10/27/2004  T:  10/27/2004  Job:  [id number]

## 2011-09-22 LAB — COMPREHENSIVE METABOLIC PANEL
ALT: 20
Alkaline Phosphatase: 93
CO2: 27
Chloride: 104
GFR calc non Af Amer: >60
Glucose, Bld: 145 — ABNORMAL HIGH
Potassium: 4.1
Sodium: 139
Total Bilirubin: 0.4

## 2011-09-22 LAB — URINE MICROSCOPIC-ADD ON

## 2011-09-22 LAB — URINALYSIS, ROUTINE W REFLEX MICROSCOPIC
Glucose, UA: NEGATIVE
Ketones, ur: NEGATIVE
Leukocytes, UA: NEGATIVE
Protein, ur: 100 — AB

## 2011-09-22 LAB — DIFFERENTIAL
Basophils Relative: 0
Eosinophils Absolute: 0.4
Neutrophils Relative %: 76

## 2011-09-22 LAB — CBC
Hemoglobin: 14.3
MCHC: 33.1
RBC: 4.8

## 2011-09-22 LAB — LIPASE, BLOOD: Lipase: 1528 — ABNORMAL HIGH

## 2012-04-30 ENCOUNTER — Emergency Department (HOSPITAL_COMMUNITY)
Admission: EM | Admit: 2012-04-30 | Discharge: 2012-04-30 | Disposition: A | Discharge status: Home / Self Care | Payer: Self-pay | Attending: Emergency Medicine | Admitting: Emergency Medicine

## 2012-04-30 ENCOUNTER — Encounter (HOSPITAL_COMMUNITY): Payer: Self-pay | Admitting: Emergency Medicine

## 2012-04-30 ENCOUNTER — Emergency Department (HOSPITAL_COMMUNITY): Payer: Self-pay

## 2012-04-30 DIAGNOSIS — Y838 Other surgical procedures as the cause of abnormal reaction of the patient, or of later complication, without mention of misadventure at the time of the procedure: Secondary | ICD-10-CM | POA: Insufficient documentation

## 2012-04-30 DIAGNOSIS — R5383 Other fatigue: Secondary | ICD-10-CM | POA: Insufficient documentation

## 2012-04-30 DIAGNOSIS — R5381 Other malaise: Secondary | ICD-10-CM | POA: Insufficient documentation

## 2012-04-30 DIAGNOSIS — R509 Fever, unspecified: Secondary | ICD-10-CM | POA: Insufficient documentation

## 2012-04-30 DIAGNOSIS — R000 Tachycardia, unspecified: Secondary | ICD-10-CM | POA: Insufficient documentation

## 2012-04-30 DIAGNOSIS — T8140XA Infection following a procedure, unspecified, initial encounter: Secondary | ICD-10-CM | POA: Insufficient documentation

## 2012-04-30 DIAGNOSIS — E119 Type 2 diabetes mellitus without complications: Secondary | ICD-10-CM | POA: Insufficient documentation

## 2012-04-30 DIAGNOSIS — T8130XA Disruption of wound, unspecified, initial encounter: Secondary | ICD-10-CM | POA: Insufficient documentation

## 2012-04-30 DIAGNOSIS — I2510 Atherosclerotic heart disease of native coronary artery without angina pectoris: Secondary | ICD-10-CM | POA: Insufficient documentation

## 2012-04-30 DIAGNOSIS — Z7982 Long term (current) use of aspirin: Secondary | ICD-10-CM | POA: Insufficient documentation

## 2012-04-30 DIAGNOSIS — Z79899 Other long term (current) drug therapy: Secondary | ICD-10-CM | POA: Insufficient documentation

## 2012-04-30 DIAGNOSIS — T8149XA Infection following a procedure, other surgical site, initial encounter: Secondary | ICD-10-CM

## 2012-04-30 HISTORY — DX: Atherosclerotic heart disease of native coronary artery without angina pectoris: I25.10

## 2012-04-30 LAB — CBC
MCHC: 32.8 g/dL (ref 30.0–36.0)
Platelets: 271 10*3/uL (ref 150–400)
RDW: 14.3 % (ref 11.5–15.5)
WBC: 14.5 10*3/uL — ABNORMAL HIGH (ref 4.0–10.5)

## 2012-04-30 LAB — COMPREHENSIVE METABOLIC PANEL
ALT: 12 U/L (ref 0–35)
AST: 17 U/L (ref 0–37)
Albumin: 3.9 g/dL (ref 3.5–5.2)
CO2: 25 mEq/L (ref 19–32)
Calcium: 9.5 mg/dL (ref 8.4–10.5)
Chloride: 100 mEq/L (ref 96–112)
GFR calc non Af Amer: 75 mL/min — ABNORMAL LOW (ref >90)
Sodium: 137 mEq/L (ref 135–145)

## 2012-04-30 LAB — URINALYSIS, ROUTINE W REFLEX MICROSCOPIC
Glucose, UA: NEGATIVE mg/dL
Hgb urine dipstick: NEGATIVE
Leukocytes, UA: NEGATIVE
Protein, ur: NEGATIVE mg/dL
pH: 7.5 (ref 5.0–8.0)

## 2012-04-30 LAB — DIFFERENTIAL
Basophils Absolute: 0 10*3/uL (ref 0.0–0.1)
Basophils Relative: 0 % (ref 0–1)
Eosinophils Relative: 1 % (ref 0–5)
Lymphocytes Relative: 17 % (ref 12–46)
Neutro Abs: 10.9 10*3/uL — ABNORMAL HIGH (ref 1.7–7.7)

## 2012-04-30 MED ORDER — SULFAMETHOXAZOLE-TRIMETHOPRIM 800-160 MG PO TABS: 1.0000 | ORAL_TABLET | Freq: Two times a day (BID) | ORAL | Status: AC

## 2012-04-30 MED ORDER — IOHEXOL 300 MG/ML  SOLN
80.0000 mL | Freq: Once | INTRAMUSCULAR | Status: AC | PRN
  Administered 2012-04-30: 80 mL via INTRAVENOUS

## 2012-04-30 MED ORDER — SODIUM CHLORIDE 0.9 % IV BOLUS (SEPSIS)
2000.0000 mL | Freq: Once | INTRAVENOUS | Status: AC
  Administered 2012-04-30: 1000 mL via INTRAVENOUS

## 2012-04-30 MED ORDER — OXYCODONE-ACETAMINOPHEN 5-325 MG PO TABS: 2.0000 | ORAL_TABLET | ORAL | Status: AC | PRN

## 2012-04-30 MED ORDER — VANCOMYCIN HCL IN DEXTROSE 1-5 GM/200ML-% IV SOLN
1000.0000 mg | Freq: Once | INTRAVENOUS | Status: AC
  Administered 2012-04-30: 1000 mg via INTRAVENOUS
  Filled 2012-04-30: qty 200

## 2012-04-30 MED ORDER — AMOXICILLIN-POT CLAVULANATE 875-125 MG PO TABS: 1.0000 | ORAL_TABLET | Freq: Two times a day (BID) | ORAL | Status: DC

## 2012-04-30 MED ORDER — SODIUM CHLORIDE 0.9 % IV SOLN
INTRAVENOUS | Status: DC
  Administered 2012-04-30: 1000 mL via INTRAVENOUS

## 2012-04-30 MED ORDER — METRONIDAZOLE IN NACL 5-0.79 MG/ML-% IV SOLN
500.0000 mg | Freq: Once | INTRAVENOUS | Status: AC
  Administered 2012-04-30: 500 mg via INTRAVENOUS
  Filled 2012-04-30: qty 100

## 2012-04-30 MED ORDER — PIPERACILLIN-TAZOBACTAM 3.375 G IVPB 30 MIN
3.3750 g | Freq: Once | INTRAVENOUS | Status: AC
  Administered 2012-04-30: 3.375 g via INTRAVENOUS
  Filled 2012-04-30: qty 50

## 2012-04-30 MED ORDER — AMOXICILLIN-POT CLAVULANATE 875-125 MG PO TABS: 1.0000 | ORAL_TABLET | Freq: Two times a day (BID) | ORAL | Status: AC

## 2012-04-30 MED ORDER — IOHEXOL 300 MG/ML  SOLN
20.0000 mL | INTRAMUSCULAR | Status: AC
  Administered 2012-04-30 (×2): 20 mL via ORAL

## 2012-04-30 NOTE — ED Notes (Signed)
Patient tolerating fluids and zosyn with NAD. Patient denies any pain. Family at bedside.

## 2012-04-30 NOTE — Discharge Instructions (Signed)
Wound Dehiscence Wound dehiscence is when a surgical cut (incision) opens up. It usually happens 7 to 10 days after surgery. You may have pain, a fever, or have more fluid coming from the cut. It should be treated early. HOME CARE  Only take medicines as told by your doctor.   Take your medicines (antibiotics) as told. Finish them even if you start to feel better.   Wash your wound with warm, soapy water 2 times a day, or as told. Pat the wound dry. Do not rub the wound.   Change bandages (dressings) as often as told. Wash your hands before and after changing bandages. Apply bandages as told.   Take showers. Do not soak the wound, bathe, or swim until your wound is healed.   Avoid exercises that make you sweat.   Use medicines that stop itching as told by your doctor. The wound may itch as it heals. Do not pick or scratch at the wound.   Do not lift more than 10 pounds (4.5 kilograms) until the wound is healed, or as told by your doctor.   Keep all doctor visits as told.  GET HELP RIGHT AWAY IF:   You have more puffiness (swelling) or redness around the wound.   You have more pain in the wound.   You have more yellowish white fluid (pus) coming from the wound.   More of the wound breaks open.   You have a fever in 2 days or later.  You develop dizziness, confusion, chest pain, shortness of breath, vomiting, diffuse abdominal pain, or other concerns. MAKE SURE YOU:   Understand these instructions.   Will watch your condition.   Will get help right away if you are not doing well or get worse.  Document Released: 11/25/2009 Document Revised: 11/26/2011 Document Reviewed: 04/12/2011 [facility] Patient Information 2012 [place], [place].

## 2012-04-30 NOTE — ED Notes (Signed)
Pt denies any further questions upon discharge. 
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
## 2012-04-30 NOTE — ED Provider Notes (Signed)
History   This chart was scribed for Hurman Horn, MD scribed by Magnus Sinning. The patient was seen in room STRE7/STRE7 seen at 14:06  CSN: [id number]  Arrival date & time 04/30/12  1156   None     Chief Complaint  Patient presents with  . Fever    (Consider location/radiation/quality/duration/timing/severity/associated sxs/prior treatment) HPI Melanie Jackson is a 56 y.o. female who presents to the Emergency Department complaining of fever with oral temperature 100.6 onset today with associated general fatigue yesterday. Pt states that she traveled to [place], [place] in February, where she had tummy tuck procedure done. Explains that since she has had a chronic red wound since February that she doesn't believe it has been healing well. She furthers that over the last few weeks she also noticed some pus drainage with wound edge opening and redness and gradually worsening pain at the incision area for a few months. States she has not felt well and suspects it is a potential infection. Denies confusion ,dizziness, n/v/d, weakness. Hx of DM, and CAD Past Medical History  Diagnosis Date  . Coronary artery disease   . Diabetes mellitus     History reviewed. No pertinent past surgical history.  No family history on file.  History  Substance Use Topics  . Smoking status: Former Games developer  . Smokeless tobacco: Not on file  . Alcohol Use: No   Review of Systems  Constitutional: Negative for fever.       10 Systems reviewed and are negative for acute change except as noted in the HPI.  HENT: Negative for congestion.   Eyes: Negative for discharge and redness.  Respiratory: Negative for cough and shortness of breath.   Cardiovascular: Negative for chest pain.  Gastrointestinal: Negative for vomiting and abdominal pain.  Musculoskeletal: Negative for back pain.  Skin: Positive for wound. Negative for rash.  Neurological: Negative for syncope, numbness and headaches.    Psychiatric/Behavioral:       No behavior change.  All other systems reviewed and are negative.    Allergies  Review of patient's allergies indicates no known allergies.  Home Medications   Current Outpatient Rx  Name Route Sig Dispense Refill  . ASPIRIN EC 81 MG PO TBEC Oral Take 81 mg by mouth daily.    Marland Kitchen BLACK COHOSH PO Oral Take 1 capsule by mouth daily.    . ENALAPRIL MALEATE 20 MG PO TABS Oral Take 20 mg by mouth daily.    Marland Kitchen METFORMIN HCL 500 MG PO TABS Oral Take 500 mg by mouth 2 (two) times daily with a meal.    . TRAZODONE HCL 100 MG PO TABS Oral Take 100 mg by mouth at bedtime.    Marland Kitchen VARENICLINE TARTRATE 1 MG PO TABS Oral Take 1 mg by mouth daily.    Marland Kitchen VITAMIN C 500 MG PO TABS Oral Take 500 mg by mouth daily.    Marland Kitchen VITAMIN E 400 UNITS PO CAPS Oral Take 400 Units by mouth daily.    . AMOXICILLIN-POT CLAVULANATE 875-125 MG PO TABS Oral Take 1 tablet by mouth 2 (two) times daily. One po bid x 7 days 14 tablet 0  . OXYCODONE-ACETAMINOPHEN 5-325 MG PO TABS Oral Take 2 tablets by mouth every 4 (four) hours as needed for pain. 20 tablet 0  . SULFAMETHOXAZOLE-TRIMETHOPRIM 800-160 MG PO TABS Oral Take 1 tablet by mouth 2 (two) times daily. One po bid x 7 days 14 tablet 0    BP  143/92  Pulse 113  Temp(Src) 98.1 F (36.7 C) (Oral)  Resp 22  SpO2 96%  Physical Exam  Nursing note and vitals reviewed. Constitutional:       Awake, alert, nontoxic appearance.  HENT:  Head: Atraumatic.  Eyes: Right eye exhibits no discharge. Left eye exhibits no discharge.  Neck: Neck supple.  Cardiovascular: Regular rhythm.  Tachycardia present.   Pulmonary/Chest: Effort normal. She exhibits no tenderness.  Abdominal: Soft. Bowel sounds are normal. There is no tenderness. There is no rebound.       Chronic periumbillical wound with central eschar several cm diameter with surrounding localized dehiscence with mild erythema induration, tenderness, firmness, and scant purulent drainage.       Musculoskeletal: She exhibits no tenderness.       Baseline ROM, no obvious new focal weakness.  Neurological:       Mental status and motor strength appears baseline for patient and situation.  Skin: No rash noted.  Psychiatric: She has a normal mood and affect.    ED Course  Procedures (including critical care time) DIAGNOSTIC STUDIES: Oxygen Saturation is 96% on room air, normal by my interpretation.    Superficial involvement of skin, no acbe COORDINATION OF CARE: 18:49: EDMD notes that results indicate no intraperitoneal abscess. Physician reviewed and explained imaging and lab results with patient/family and answered questions. CT results discussed and patient mentions that she has chronic left kidney problem, as it was made aware from CT. Patient may follow up with physician in [facility] or may stay locally and go to [facility] surgery.   Labs Reviewed  CBC - Abnormal; Notable for the following:    WBC 14.5 (*)    All other components within normal limits  DIFFERENTIAL - Abnormal; Notable for the following:    Neutro Abs 10.9 (*)    All other components within normal limits  COMPREHENSIVE METABOLIC PANEL - Abnormal; Notable for the following:    Glucose, Bld 111 (*)    Total Bilirubin 0.2 (*)    GFR calc non Af Amer 75 (*)    GFR calc Af Amer 87 (*)    All other components within normal limits  LIPASE, BLOOD  URINALYSIS, ROUTINE W REFLEX MICROSCOPIC  WOUND CULTURE  LACTIC ACID, PLASMA  CULTURE, BLOOD (ROUTINE X 2)  CULTURE, BLOOD (ROUTINE X 2)   Ct Abdomen Pelvis W Contrast  04/30/2012  *RADIOLOGY REPORT*  Clinical Data: Left lower quadrant pain. Liposuction in February 2013  CT ABDOMEN AND PELVIS WITH CONTRAST  Technique:  Multidetector CT imaging of the abdomen and pelvis was performed following the standard protocol during bolus administration of intravenous contrast.  Contrast: 80mL OMNIPAQUE IOHEXOL 300 MG/ML  SOLN  Comparison: CT abdomen 10/20/2004.  Findings: In  the ventral abdominal wall, below the umbilicus, there is disruption of the skin surface.  There is a fluid collection superficial to the fascial layer midline which measures 4.8 x 1.6 x 3.7 cm.  The rectus abdominis muscles appear normal.  No evidence of abscess within the peritoneal space.  Lung bases are clear.  No focal hepatic lesion.  Gallbladder, pancreas, spleen, adrenal glands are normal.  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  There is compensatory hypertrophy of the right kidney.  The stomach, small bowel, and colon are normal.  No diverticula the sigmoid colon.  Abdominal aorta normal caliber.  No retroperitoneal periportal lymphadenopathy.  The uterus and ovaries are normal.  Bladder is  normal. Review of bone windows demonstrates no aggressive osseous lesions.  IMPRESSION:  1. A thin subcutaneous fluid collection along the ventral abdominal wall  beneath the skin disruption.  Differential includes sterile or infected fluid collection.   2.  No evidence of intraperitoneal abscess.  Original Report Authenticated By: Genevive Bi, M.D.     1. Postoperative wound infection   2. Abdominal wound dehiscence       MDM   I personally performed the services described in this documentation, which was scribed in my presence. The recorded information has been reviewed and considered. Pt stable in ED with no significant deterioration in condition.Patient / Family / Caregiver informed of clinical course, understand medical decision-making process, and agree with plan.I doubt any other EMC precluding discharge at this time including, but not necessarily limited to the following:sepsis, nec fasc.      Hurman Horn, MD 05/01/12 [phone]

## 2012-04-30 NOTE — ED Notes (Signed)
Patient states she has been fatigued x several days and started having fever today. Patient states she [place] country [place] and started to notice redness and skin piling away with yellow fluid coming out of the wound. Wound is several CM round on her abdomen bedside umbilicus.

## 2012-05-01 NOTE — ED Notes (Addendum)
[facility] called regarding Augmentin not covered by insurance. Was prescribed Bactrim DS also which was covered by insurance. Chart taken to and reviewed by  EDP Dr Weldon Inches. V.O. Given to d/c Augmentin and have patient take Bactrim DS. [facility] [phone] notified of change.

## 2012-05-03 LAB — WOUND CULTURE

## 2012-05-03 NOTE — ED Notes (Signed)
Patient informed of ESBL results and informed of need to complete medication or follow up if needed.Patient stated that she was not any better.She will return for follow up.

## 2012-05-03 NOTE — ED Notes (Signed)
[facility] lab call with positive wound- ESBL.

## 2012-05-03 NOTE — ED Notes (Signed)
Patient treated with Bactrim DS-sensitive to same-Patient need to be notified . To ensure that he is healing well.

## 2012-05-06 LAB — CULTURE, BLOOD (ROUTINE X 2)
Culture  Setup Time: 201305112057
Culture: NO GROWTH

## 2014-03-04 ENCOUNTER — Encounter (HOSPITAL_COMMUNITY): Payer: Self-pay | Admitting: Emergency Medicine

## 2014-03-04 ENCOUNTER — Emergency Department (HOSPITAL_COMMUNITY)
Admission: EM | Admit: 2014-03-04 | Discharge: 2014-03-04 | Disposition: A | Discharge status: Home / Self Care | Payer: BC Managed Care – PPO | Attending: Emergency Medicine | Admitting: Emergency Medicine

## 2014-03-04 ENCOUNTER — Emergency Department (HOSPITAL_COMMUNITY): Payer: BC Managed Care – PPO

## 2014-03-04 DIAGNOSIS — Z23 Encounter for immunization: Secondary | ICD-10-CM | POA: Insufficient documentation

## 2014-03-04 DIAGNOSIS — W292XXA Contact with other powered household machinery, initial encounter: Secondary | ICD-10-CM | POA: Insufficient documentation

## 2014-03-04 DIAGNOSIS — Z7982 Long term (current) use of aspirin: Secondary | ICD-10-CM | POA: Insufficient documentation

## 2014-03-04 DIAGNOSIS — Y9389 Activity, other specified: Secondary | ICD-10-CM | POA: Insufficient documentation

## 2014-03-04 DIAGNOSIS — I2510 Atherosclerotic heart disease of native coronary artery without angina pectoris: Secondary | ICD-10-CM | POA: Insufficient documentation

## 2014-03-04 DIAGNOSIS — E119 Type 2 diabetes mellitus without complications: Secondary | ICD-10-CM | POA: Insufficient documentation

## 2014-03-04 DIAGNOSIS — Y929 Unspecified place or not applicable: Secondary | ICD-10-CM | POA: Insufficient documentation

## 2014-03-04 DIAGNOSIS — Z87891 Personal history of nicotine dependence: Secondary | ICD-10-CM | POA: Insufficient documentation

## 2014-03-04 DIAGNOSIS — Z79899 Other long term (current) drug therapy: Secondary | ICD-10-CM | POA: Insufficient documentation

## 2014-03-04 DIAGNOSIS — S61219A Laceration without foreign body of unspecified finger without damage to nail, initial encounter: Secondary | ICD-10-CM

## 2014-03-04 DIAGNOSIS — I10 Essential (primary) hypertension: Secondary | ICD-10-CM | POA: Insufficient documentation

## 2014-03-04 DIAGNOSIS — S61209A Unspecified open wound of unspecified finger without damage to nail, initial encounter: Secondary | ICD-10-CM | POA: Insufficient documentation

## 2014-03-04 HISTORY — DX: Essential (primary) hypertension: I10

## 2014-03-04 MED ORDER — TETANUS-DIPHTH-ACELL PERTUSSIS 5-2.5-18.5 LF-MCG/0.5 IM SUSP
0.5000 mL | Freq: Once | INTRAMUSCULAR | Status: AC
  Administered 2014-03-04: 0.5 mL via INTRAMUSCULAR
  Filled 2014-03-04: qty 0.5

## 2014-03-04 NOTE — ED Notes (Signed)
Pt states while using hand blender, accidentally placed R index finger in blades, bleeding controlled.

## 2014-03-04 NOTE — ED Provider Notes (Signed)
CSN: [id number]     Arrival date & time 03/04/14  2218 History   First MD Initiated Contact with Patient 03/04/14 2230     Chief Complaint  Patient presents with  . Laceration     (Consider location/radiation/quality/duration/timing/severity/associated sxs/prior Treatment) HPI Comments: Patient presents emergency department with chief complaint of finger laceration. She states that she was operating a blender this evening, when she cut her finger. Bleeding is controlled. She denies any pain. Last tetanus shot is unknown. There no aggravating or alleviating factors.   The history is provided by the patient. No language interpreter was used.    Past Medical History  Diagnosis Date  . Coronary artery disease   . Diabetes mellitus   . Hypertension    History reviewed. No pertinent past surgical history. No family history on file. History  Substance Use Topics  . Smoking status: Former Games developer  . Smokeless tobacco: Not on file  . Alcohol Use: No   OB History   Grav Para Term Preterm Abortions TAB SAB Ect Mult Living                 Review of Systems  Constitutional: Negative for fever and chills.  Respiratory: Negative for shortness of breath.   Cardiovascular: Negative for chest pain.  Gastrointestinal: Negative for nausea, vomiting, diarrhea and constipation.  Genitourinary: Negative for dysuria.      Allergies  Review of patient's allergies indicates no known allergies.  Home Medications   Current Outpatient Rx  Name  Route  Sig  Dispense  Refill  . aspirin EC 81 MG tablet   Oral   Take 81 mg by mouth daily.         Marland Kitchen BLACK COHOSH PO   Oral   Take 1 capsule by mouth daily.         . enalapril (VASOTEC) 20 MG tablet   Oral   Take 20 mg by mouth daily.         . metFORMIN (GLUCOPHAGE) 500 MG tablet   Oral   Take 500 mg by mouth 2 (two) times daily with a meal.         . traZODone (DESYREL) 100 MG tablet   Oral   Take 100 mg by mouth at  bedtime.         . varenicline (CHANTIX) 1 MG tablet   Oral   Take 1 mg by mouth daily.         . vitamin C (ASCORBIC ACID) 500 MG tablet   Oral   Take 500 mg by mouth daily.         . vitamin E 400 UNIT capsule   Oral   Take 400 Units by mouth daily.          BP 180/105  Pulse 71  Temp(Src) 97.6 F (36.4 C)  Resp 16  Ht 4\' 9"  (1.448 m)  Wt 146 lb (66.225 kg)  BMI 31.59 kg/m2  SpO2 95% Physical Exam  Nursing note and vitals reviewed. Constitutional: She is oriented to person, place, and time. She appears well-developed and well-nourished.  HENT:  Head: Normocephalic and atraumatic.  Eyes: Conjunctivae and EOM are normal.  Neck: Normal range of motion.  Cardiovascular: Normal rate.   Pulmonary/Chest: Effort normal.  Abdominal: She exhibits no distension.  Musculoskeletal: Normal range of motion.  Neurological: She is alert and oriented to person, place, and time.  Skin: Skin is dry.  1.5-2 cm semi-circumferential laceration to the right index  finger near the DIP, no evidence of foreign body, no evidence of tendon involvement the laceration is clean  Psychiatric: She has a normal mood and affect. Her behavior is normal. Judgment and thought content normal.    ED Course  Procedures (including critical care time) Results for orders placed during the hospital encounter of 04/30/12  WOUND CULTURE      Result Value Ref Range   Specimen Description WOUND ABDOMEN     Special Requests INCISION     Gram Stain       Value: FEW WBC PRESENT, PREDOMINANTLY PMN     NO SQUAMOUS EPITHELIAL CELLS SEEN     FEW GRAM NEGATIVE RODS   Culture       Value: MODERATE KLEBSIELLA PNEUMONIAE     Note: Confirmed Extended Spectrum Beta-Lactamase Producer (ESBL) CRITICAL RESULT CALLED TO, READ BACK BY AND VERIFIED WITH: TONY FESTERMAN @11 :47 AM  05/03/12 BY DWEEKS   Report Status 05/03/2012 FINAL     Organism ID, Bacteria KLEBSIELLA PNEUMONIAE    CULTURE, BLOOD (ROUTINE X 2)       Result Value Ref Range   Specimen Description BLOOD RIGHT ARM     Special Requests BOTTLES DRAWN AEROBIC AND ANAEROBIC 10CC     Culture  Setup Time [id number]     Culture NO GROWTH 5 DAYS     Report Status 05/06/2012 FINAL    CULTURE, BLOOD (ROUTINE X 2)      Result Value Ref Range   Specimen Description BLOOD LEFT ARM     Special Requests BOTTLES DRAWN AEROBIC AND ANAEROBIC 10CC     Culture  Setup Time [id number]     Culture NO GROWTH 5 DAYS     Report Status 05/06/2012 FINAL    CBC      Result Value Ref Range   WBC 14.5 (*) 4.0 - 10.5 K/uL   RBC 4.46  3.87 - 5.11 MIL/uL   Hemoglobin 12.8  12.0 - 15.0 g/dL   HCT 21.3  08.6 - 57.8 %   MCV 87.4  78.0 - 100.0 fL   MCH 28.7  26.0 - 34.0 pg   MCHC 32.8  30.0 - 36.0 g/dL   RDW 46.9  62.9 - 52.8 %   Platelets 271  150 - 400 K/uL  DIFFERENTIAL      Result Value Ref Range   Neutrophils Relative % 75  43 - 77 %   Neutro Abs 10.9 (*) 1.7 - 7.7 K/uL   Lymphocytes Relative 17  12 - 46 %   Lymphs Abs 2.5  0.7 - 4.0 K/uL   Monocytes Relative 6  3 - 12 %   Monocytes Absolute 0.9  0.1 - 1.0 K/uL   Eosinophils Relative 1  0 - 5 %   Eosinophils Absolute 0.2  0.0 - 0.7 K/uL   Basophils Relative 0  0 - 1 %   Basophils Absolute 0.0  0.0 - 0.1 K/uL  COMPREHENSIVE METABOLIC PANEL      Result Value Ref Range   Sodium 137  135 - 145 mEq/L   Potassium 4.3  3.5 - 5.1 mEq/L   Chloride 100  96 - 112 mEq/L   CO2 25  19 - 32 mEq/L   Glucose, Bld 111 (*) 70 - 99 mg/dL   BUN 10  6 - 23 mg/dL   Creatinine, Ser 4.13  0.50 - 1.10 mg/dL   Calcium 9.5  8.4 - 24.4 mg/dL   Total Protein 7.8  6.0 -  8.3 g/dL   Albumin 3.9  3.5 - 5.2 g/dL   AST 17  0 - 37 U/L   ALT 12  0 - 35 U/L   Alkaline Phosphatase 92  39 - 117 U/L   Total Bilirubin 0.2 (*) 0.3 - 1.2 mg/dL   GFR calc non Af Amer 75 (*) >90 mL/min   GFR calc Af Amer 87 (*) >90 mL/min  LIPASE, BLOOD      Result Value Ref Range   Lipase 25  11 - 59 U/L  URINALYSIS, ROUTINE W REFLEX MICROSCOPIC       Result Value Ref Range   Color, Urine YELLOW  YELLOW   APPearance CLEAR  CLEAR   Specific Gravity, Urine 1.019  1.005 - 1.030   pH 7.5  5.0 - 8.0   Glucose, UA NEGATIVE  NEGATIVE mg/dL   Hgb urine dipstick NEGATIVE  NEGATIVE   Bilirubin Urine NEGATIVE  NEGATIVE   Ketones, ur NEGATIVE  NEGATIVE mg/dL   Protein, ur NEGATIVE  NEGATIVE mg/dL   Urobilinogen, UA 1.0  0.0 - 1.0 mg/dL   Nitrite NEGATIVE  NEGATIVE   Leukocytes, UA NEGATIVE  NEGATIVE  LACTIC ACID, PLASMA      Result Value Ref Range   Lactic Acid, Venous 1.2  0.5 - 2.2 mmol/L   Dg Finger Index Right  03/04/2014   CLINICAL DATA:  Laceration  EXAM: RIGHT INDEX FINGER 2+V  COMPARISON:  None.  FINDINGS: There is no evidence of fracture or dislocation. There is no evidence of arthropathy or other focal bone abnormality. Soft tissue irregularity at the radial aspect of the distal right index finger is most compatible with laceration. No radiopaque foreign body.  IMPRESSION: 1. Soft tissue laceration with no radiopaque foreign body identified. 2. No acute fracture or or dislocation.   Electronically Signed   By: Rise MuBenjamin  McClintock M.D.   On: 03/04/2014 23:27    LACERATION REPAIR Performed by: Roxy HorsemanBROWNING, Ruhee Enck Authorized by: Roxy HorsemanBROWNING, Mildred Tuccillo Consent: Verbal consent obtained. Risks and benefits: risks, benefits and alternatives were discussed Consent given by: patient Patient identity confirmed: provided demographic data Prepped and Draped in normal sterile fashion Wound explored  Laceration Location: right distal index finger  Laceration Length: 1.5-2cm  No Foreign Bodies seen or palpated  Anesthesia: local infiltration  Local anesthetic: lidocaine 1% without epinephrine  Anesthetic total: 1 ml  Irrigation method: syringe Amount of cleaning: standard  Skin closure: 4-0 vicryl rapid and dermabond  Number of sutures: 6  Technique: interrupted  Patient tolerance: Patient tolerated the procedure well with no  immediate complications.    EKG Interpretation None      MDM   Final diagnoses:  Finger laceration    Patient with laceration. Laceration was thoroughly irrigated and the emergency department and repaired with sutures. Patient given finger splint to prevent tearing through stitches. PCP/hand followup. Patient understands and agrees with plan. She is stable and ready for discharge.    Roxy Horsemanobert Ziggy Reveles, PA-C 03/04/14 2339

## 2014-03-04 NOTE — Discharge Instructions (Signed)
Please follow-up with your regular doctor, or return to to the ED or see the hand specialist if symptoms worsen.  The stitches should come out in 10 days.  They should dissolve naturally.  Laceration Care, Adult A laceration is a cut or lesion that goes through all layers of the skin and into the tissue just beneath the skin. TREATMENT  Some lacerations may not require closure. Some lacerations may not be able to be closed due to an increased risk of infection. It is important to see your caregiver as soon as possible after an injury to minimize the risk of infection and maximize the opportunity for successful closure. If closure is appropriate, pain medicines may be given, if needed. The wound will be cleaned to help prevent infection. Your caregiver will use stitches (sutures), staples, wound glue (adhesive), or skin adhesive strips to repair the laceration. These tools bring the skin edges together to allow for faster healing and a better cosmetic outcome. However, all wounds will heal with a scar. Once the wound has healed, scarring can be minimized by covering the wound with sunscreen during the day for 1 full year. HOME CARE INSTRUCTIONS  For sutures or staples:  Keep the wound clean and dry.  If you were given a bandage (dressing), you should change it at least once a day. Also, change the dressing if it becomes wet or dirty, or as directed by your caregiver.  Wash the wound with soap and water 2 times a day. Rinse the wound off with water to remove all soap. Pat the wound dry with a clean towel.  After cleaning, apply a thin layer of the antibiotic ointment as recommended by your caregiver. This will help prevent infection and keep the dressing from sticking.  You may shower as usual after the first 24 hours. Do not soak the wound in water until the sutures are removed.  Only take over-the-counter or prescription medicines for pain, discomfort, or fever as directed by your  caregiver.  Get your sutures or staples removed as directed by your caregiver. For skin adhesive strips:  Keep the wound clean and dry.  Do not get the skin adhesive strips wet. You may bathe carefully, using caution to keep the wound dry.  If the wound gets wet, pat it dry with a clean towel.  Skin adhesive strips will fall off on their own. You may trim the strips as the wound heals. Do not remove skin adhesive strips that are still stuck to the wound. They will fall off in time. For wound adhesive:  You may briefly wet your wound in the shower or bath. Do not soak or scrub the wound. Do not swim. Avoid periods of heavy perspiration until the skin adhesive has fallen off on its own. After showering or bathing, gently pat the wound dry with a clean towel.  Do not apply liquid medicine, cream medicine, or ointment medicine to your wound while the skin adhesive is in place. This may loosen the film before your wound is healed.  If a dressing is placed over the wound, be careful not to apply tape directly over the skin adhesive. This may cause the adhesive to be pulled off before the wound is healed.  Avoid prolonged exposure to sunlight or tanning lamps while the skin adhesive is in place. Exposure to ultraviolet light in the first year will darken the scar.  The skin adhesive will usually remain in place for 5 to 10 days, then naturally fall  off the skin. Do not pick at the adhesive film. You may need a tetanus shot if:  You cannot remember when you had your last tetanus shot.  You have never had a tetanus shot. If you get a tetanus shot, your arm may swell, get red, and feel warm to the touch. This is common and not a problem. If you need a tetanus shot and you choose not to have one, there is a rare chance of getting tetanus. Sickness from tetanus can be serious. SEEK MEDICAL CARE IF:   You have redness, swelling, or increasing pain in the wound.  You see a red line that goes away  from the wound.  You have yellowish-white fluid (pus) coming from the wound.  You have a fever.  You notice a bad smell coming from the wound or dressing.  Your wound breaks open before or after sutures have been removed.  You notice something coming out of the wound such as wood or glass.  Your wound is on your hand or foot and you cannot move a finger or toe. SEEK IMMEDIATE MEDICAL CARE IF:   Your pain is not controlled with prescribed medicine.  You have severe swelling around the wound causing pain and numbness or a change in color in your arm, hand, leg, or foot.  Your wound splits open and starts bleeding.  You have worsening numbness, weakness, or loss of function of any joint around or beyond the wound.  You develop painful lumps near the wound or on the skin anywhere on your body. MAKE SURE YOU:   Understand these instructions.  Will watch your condition.  Will get help right away if you are not doing well or get worse. Document Released: 12/07/2005 Document Revised: 02/29/2012 Document Reviewed: 06/02/2011 [facility] Patient Information 2014 [place], [place].

## 2014-03-04 NOTE — ED Notes (Signed)
Ortho tech called for finger splint application

## 2014-03-07 NOTE — ED Provider Notes (Signed)
Medical screening examination/treatment/procedure(s) were performed by non-physician practitioner and as supervising physician I was immediately available for consultation/collaboration.   EKG Interpretation None        David H Yao, MD 03/07/14 1626 

## 2023-02-26 ENCOUNTER — Emergency Department (HOSPITAL_COMMUNITY)
Admission: EM | Admit: 2023-02-26 | Discharge: 2023-02-27 | Disposition: A | Discharge status: Home / Self Care | Payer: Medicare HMO | Attending: Emergency Medicine | Admitting: Emergency Medicine

## 2023-02-26 ENCOUNTER — Other Ambulatory Visit: Payer: Self-pay

## 2023-02-26 ENCOUNTER — Encounter (HOSPITAL_COMMUNITY): Payer: Self-pay

## 2023-02-26 ENCOUNTER — Emergency Department (HOSPITAL_COMMUNITY): Payer: Medicare HMO

## 2023-02-26 DIAGNOSIS — I2510 Atherosclerotic heart disease of native coronary artery without angina pectoris: Secondary | ICD-10-CM | POA: Insufficient documentation

## 2023-02-26 DIAGNOSIS — Z7982 Long term (current) use of aspirin: Secondary | ICD-10-CM | POA: Insufficient documentation

## 2023-02-26 DIAGNOSIS — Z7984 Long term (current) use of oral hypoglycemic drugs: Secondary | ICD-10-CM | POA: Diagnosis not present

## 2023-02-26 DIAGNOSIS — M79601 Pain in right arm: Secondary | ICD-10-CM | POA: Diagnosis present

## 2023-02-26 DIAGNOSIS — R079 Chest pain, unspecified: Secondary | ICD-10-CM | POA: Diagnosis not present

## 2023-02-26 DIAGNOSIS — E119 Type 2 diabetes mellitus without complications: Secondary | ICD-10-CM | POA: Diagnosis not present

## 2023-02-26 DIAGNOSIS — Z79899 Other long term (current) drug therapy: Secondary | ICD-10-CM | POA: Insufficient documentation

## 2023-02-26 DIAGNOSIS — I10 Essential (primary) hypertension: Secondary | ICD-10-CM | POA: Insufficient documentation

## 2023-02-26 LAB — TROPONIN I (HIGH SENSITIVITY)
Troponin I (High Sensitivity): 4 ng/L (ref <18)
Troponin I (High Sensitivity): 5 ng/L (ref <18)

## 2023-02-26 LAB — CBC
HCT: 41.5 % (ref 36.0–46.0)
Hemoglobin: 13 g/dL (ref 12.0–15.0)
MCH: 29.2 pg (ref 26.0–34.0)
MCHC: 31.3 g/dL (ref 30.0–36.0)
MCV: 93.3 fL (ref 80.0–100.0)
Platelets: 196 10*3/uL (ref 150–400)
RBC: 4.45 MIL/uL (ref 3.87–5.11)
RDW: 13.2 % (ref 11.5–15.5)
WBC: 8.4 10*3/uL (ref 4.0–10.5)
nRBC: 0 % (ref 0.0–0.2)

## 2023-02-26 LAB — BASIC METABOLIC PANEL
Anion gap: 9 (ref 5–15)
BUN: 8 mg/dL (ref 8–23)
CO2: 29 mmol/L (ref 22–32)
Calcium: 9.1 mg/dL (ref 8.9–10.3)
Chloride: 99 mmol/L (ref 98–111)
Creatinine, Ser: 0.98 mg/dL (ref 0.44–1.00)
GFR, Estimated: >60 mL/min (ref >60)
Glucose, Bld: 98 mg/dL (ref 70–99)
Potassium: 3.6 mmol/L (ref 3.5–5.1)
Sodium: 137 mmol/L (ref 135–145)

## 2023-02-26 NOTE — ED Triage Notes (Signed)
Pt reports last night she had right arm and shoulder pain. She reports she had a heart attack in 2012 which presented with similar symptoms. She denies chest pain but states she took a nitro tablet last night which relieved her pain. She also reports she had an episode of bilateral arm numbness and tingling that occurred this morning. She states she is not in any pain right now but states her hands are tingly.

## 2023-02-26 NOTE — ED Provider Triage Note (Signed)
Emergency Medicine Provider Triage Evaluation Note  LOJAIN MAGDALENO , a 67 y.o. female  was evaluated in triage.  Pt complains of right side back pain, knot in shoulder, radiates down right arm, onset last night. Feels like prior MI (stent x 1). Today felt a little better, took nitro which helped, arms still tingling. Had CP 2 days ago. Intermittent body swelling.  Review of Systems  Positive:  Negative:   Physical Exam  BP (!) 166/90 (BP Location: Right Arm)   Pulse 73   Temp (!) 97.5 F (36.4 C) (Oral)   Resp 16   Ht 5' (1.524 m)   Wt 51.7 kg   SpO2 98%   BMI 22.26 kg/m  Gen:   Awake, no distress   Resp:  Normal effort  MSK:   Moves extremities without difficulty  Other:  No pain with palpation   Medical Decision Making  Medically screening exam initiated at 8:14 PM.  Appropriate orders placed.  Ahnna Trevett Nader was informed that the remainder of the evaluation will be completed by another provider, this initial triage assessment does not replace that evaluation, and the importance of remaining in the ED until their evaluation is complete.     Tacy Learn, PA-C 02/26/23 2017

## 2023-02-26 NOTE — ED Provider Notes (Incomplete)
Melanie Jackson   CSN: [id number] Arrival date & time: 02/26/23  1921     History  Chief Complaint  Patient presents with  . Shoulder Pain    Melanie Jackson is a 67 y.o. female with medical history of CAD, diabetes, hypertension, MI.  Patient presents to ED for evaluation of right arm pain.  Patient reports that some years ago she had MI.  Patient reports that her symptoms prior to the MI were right arm and shoulder pain.  Patient reports that yesterday she developed right arm and shoulder pain along with neck pain.  Patient reports that she took nitroglycerin and her right shoulder pain and right arm pain relieved itself.  Patient reports that this morning she woke up with bilateral left and right upper extremity numbness along with slight nausea.  Patient denies pain at this time.  Patient denies any chest pain or shortness of breath.  Patient states that she presented to ED because she was concerned due to similar presentation during her last MI.  Patient reports that she would have come last night however could not find anyone to take care of her dogs.  Patient denies abdominal pain, vomiting, chest pain, shortness of breath, fevers, leg swelling, lightheadedness, dizziness or weakness.   Shoulder Pain      Home Medications Prior to Admission medications   Medication Sig Start Date End Date Taking? Authorizing Provider  aspirin EC 81 MG tablet Take 81 mg by mouth daily.    [provider]  BLACK COHOSH PO Take 1 capsule by mouth daily.    [provider]  enalapril (VASOTEC) 20 MG tablet Take 20 mg by mouth daily.    [provider]  metFORMIN (GLUCOPHAGE) 500 MG tablet Take 500 mg by mouth 2 (two) times daily with a meal.    [provider]  traZODone (DESYREL) 100 MG tablet Take 100 mg by mouth at bedtime.    [provider]  varenicline (CHANTIX) 1 MG tablet Take 1 mg by mouth  daily.    [provider]  vitamin C (ASCORBIC ACID) 500 MG tablet Take 500 mg by mouth daily.    [provider]  vitamin E 400 UNIT capsule Take 400 Units by mouth daily.    [provider]      Allergies    Patient has no known allergies.    Review of Systems   Review of Systems  Respiratory:  Negative for shortness of breath.   Cardiovascular:  Negative for chest pain.  Gastrointestinal:  Positive for nausea.  Musculoskeletal:  Positive for arthralgias.  All other systems reviewed and are negative.   Physical Exam Updated Vital Signs BP (!) 208/103 (BP Location: Left Arm)   Pulse 64   Temp 98.3 F (36.8 C)   Resp 16   Ht 5' (1.524 m)   Wt 51.7 kg   SpO2 98%   BMI 22.26 kg/m  Physical Exam Vitals and nursing Jackson reviewed.  Constitutional:      General: She is not in acute distress.    Appearance: She is well-developed.  HENT:     Head: Normocephalic and atraumatic.     Nose: Nose normal.     Mouth/Throat:     Mouth: Mucous membranes are moist.     Pharynx: Oropharynx is clear.  Eyes:     Conjunctiva/sclera: Conjunctivae normal.  Cardiovascular:     Rate and Rhythm: Normal rate  and regular rhythm.     Heart sounds: No murmur heard. Pulmonary:     Effort: Pulmonary effort is normal. No respiratory distress.     Breath sounds: Normal breath sounds.  Abdominal:     Palpations: Abdomen is soft.     Tenderness: There is no abdominal tenderness.  Musculoskeletal:        General: No swelling.     Cervical back: Neck supple.     Right lower leg: No edema.     Left lower leg: No edema.  Skin:    General: Skin is warm and dry.     Capillary Refill: Capillary refill takes less than 2 seconds.  Neurological:     Mental Status: She is alert.  Psychiatric:        Mood and Affect: Mood normal.     ED Results / Procedures / Treatments   Labs (all labs ordered are listed, but only abnormal results are displayed) Labs Reviewed  BASIC  METABOLIC PANEL  CBC  TROPONIN I (HIGH SENSITIVITY)  TROPONIN I (HIGH SENSITIVITY)    EKG None  Radiology DG Chest 2 View  Result Date: 02/26/2023 CLINICAL DATA:  Chest pain. EXAM: CHEST - 2 VIEW COMPARISON:  Remote radiograph 03/04/2009 FINDINGS: The cardiomediastinal contours are normal. The lungs are clear. Pulmonary vasculature is normal. No consolidation, pleural effusion, or pneumothorax. Mild thoracic spondylosis. No acute osseous abnormalities are seen. IMPRESSION: No acute chest findings. Electronically Signed   By: Keith Rake M.D.   On: 02/26/2023 20:47    Procedures Procedures   Medications Ordered in ED Medications - No data to display  ED Course/ Medical Decision Making/ A&P  Medical Decision Making  67 year old female presents to the ED for evaluation.  Please see HPI for further details.  On examination the patient is afebrile and nontachycardic.  The patient lung sounds are clear bilaterally, she is not hypoxic.  The abdomen is soft and compressible throughout.  Neurological examination shows no focal neurodeficits.  The patient has no bilateral lower extremity edema.  Patient workup will include CBC, BMP, troponin x 2, chest x-ray, EKG.  CBC unremarkable without leukocytosis or anemia.  BMP without electrolyte derangement, stable creatinine.  Patient troponin 4 and 5 respectively.  Patient chest x-ray shows no consolidations or effusions.  EKG is nonischemic.  Due to patient story and past medical history, did consult with Dr. Chancy Milroy of cardiology.  Dr. Chancy Milroy states that due to the patient having flat troponins, reassuring EKG and no active chest pain the patient can be discharged home and can follow-up with either her cardiologist in [place] or can be referred to cardiology here at [facility].     Final Clinical Impression(s) / ED Diagnoses Final diagnoses:  None    Rx / DC Orders ED Discharge Orders     None

## 2023-02-26 NOTE — ED Provider Notes (Signed)
[facility] Provider Note   CSN: [id number] Arrival date & time: 02/26/23  1921     History  Chief Complaint  Patient presents with   Shoulder Pain    Melanie Jackson is a 67 y.o. female with medical history of CAD, diabetes, hypertension, MI.  Patient presents to ED for evaluation of right arm pain.  Patient reports that some years ago she had MI.  Patient reports that her symptoms prior to the MI were right arm and shoulder pain.  Patient reports that yesterday she developed right arm and shoulder pain along with neck pain.  Patient reports that she took nitroglycerin and her right shoulder pain and right arm pain relieved itself.  Patient reports that this morning she woke up with bilateral left and right upper extremity numbness along with slight nausea.  Patient denies pain at this time.  Patient denies any chest pain or shortness of breath.  Patient states that she presented to ED because she was concerned due to similar presentation during her last MI.  Patient reports that she would have come last night however could not find anyone to take care of her dogs.  Patient denies abdominal pain, vomiting, chest pain, shortness of breath, fevers, leg swelling, lightheadedness, dizziness or weakness.   Shoulder Pain      Home Medications Prior to Admission medications   Medication Sig Start Date End Date Taking? Authorizing Provider  aspirin EC 81 MG tablet Take 81 mg by mouth daily.    [provider]  BLACK COHOSH PO Take 1 capsule by mouth daily.    [provider]  enalapril (VASOTEC) 20 MG tablet Take 20 mg by mouth daily.    [provider]  metFORMIN (GLUCOPHAGE) 500 MG tablet Take 500 mg by mouth 2 (two) times daily with a meal.    [provider]  traZODone (DESYREL) 100 MG tablet Take 100 mg by mouth at bedtime.    [provider]  varenicline (CHANTIX) 1 MG tablet Take 1 mg by mouth  daily.    [provider]  vitamin C (ASCORBIC ACID) 500 MG tablet Take 500 mg by mouth daily.    [provider]  vitamin E 400 UNIT capsule Take 400 Units by mouth daily.    [provider]      Allergies    Patient has no known allergies.    Review of Systems   Review of Systems  Respiratory:  Negative for shortness of breath.   Cardiovascular:  Negative for chest pain.  Gastrointestinal:  Positive for nausea.  Musculoskeletal:  Positive for arthralgias.  All other systems reviewed and are negative.   Physical Exam Updated Vital Signs BP (!) 162/95   Pulse (!) 59   Temp 98.3 F (36.8 C)   Resp 18   Ht 5' (1.524 m)   Wt 51.7 kg   SpO2 95%   BMI 22.26 kg/m  Physical Exam Vitals and nursing note reviewed.  Constitutional:      General: She is not in acute distress.    Appearance: She is well-developed.  HENT:     Head: Normocephalic and atraumatic.     Nose: Nose normal.     Mouth/Throat:     Mouth: Mucous membranes are moist.     Pharynx: Oropharynx is clear.  Eyes:     Conjunctiva/sclera: Conjunctivae normal.  Cardiovascular:     Rate and Rhythm: Normal rate and regular rhythm.  Heart sounds: No murmur heard. Pulmonary:     Effort: Pulmonary effort is normal. No respiratory distress.     Breath sounds: Normal breath sounds.  Abdominal:     Palpations: Abdomen is soft.     Tenderness: There is no abdominal tenderness.  Musculoskeletal:        General: No swelling.     Cervical back: Neck supple.     Right lower leg: No edema.     Left lower leg: No edema.  Skin:    General: Skin is warm and dry.     Capillary Refill: Capillary refill takes less than 2 seconds.  Neurological:     Mental Status: She is alert.  Psychiatric:        Mood and Affect: Mood normal.     ED Results / Procedures / Treatments   Labs (all labs ordered are listed, but only abnormal results are displayed) Labs Reviewed  BASIC METABOLIC PANEL  CBC   TROPONIN I (HIGH SENSITIVITY)  TROPONIN I (HIGH SENSITIVITY)    EKG EKG Interpretation  Date/Time:  Friday February 26 2023 20:09:06 EST Ventricular Rate:  66 PR Interval:  148 QRS Duration: 80 QT Interval:  406 QTC Calculation: 425 R Axis:   15 Text Interpretation: Normal sinus rhythm Cannot rule out Anterior infarct , age undetermined No significant change since last tracing When compared with ECG of 26-Feb-2023 20:08, PREVIOUS ECG IS PRESENT Confirmed by Blanchie Dessert [phone]) on 02/26/2023 11:12:13 PM  Radiology DG Chest 2 View  Result Date: 02/26/2023 CLINICAL DATA:  Chest pain. EXAM: CHEST - 2 VIEW COMPARISON:  Remote radiograph 03/04/2009 FINDINGS: The cardiomediastinal contours are normal. The lungs are clear. Pulmonary vasculature is normal. No consolidation, pleural effusion, or pneumothorax. Mild thoracic spondylosis. No acute osseous abnormalities are seen. IMPRESSION: No acute chest findings. Electronically Signed   By: Keith Rake M.D.   On: 02/26/2023 20:47    Procedures Procedures   Medications Ordered in ED Medications - No data to display  ED Course/ Medical Decision Making/ A&P  Medical Decision Making  67 year old female presents to the ED for evaluation.  Please see HPI for further details.  On examination the patient is afebrile and nontachycardic.  The patient lung sounds are clear bilaterally, she is not hypoxic.  The abdomen is soft and compressible throughout.  Neurological examination shows no focal neurodeficits.  The patient has no bilateral lower extremity edema.  Patient workup will include CBC, BMP, troponin x 2, chest x-ray, EKG.  CBC unremarkable without leukocytosis or anemia.  BMP without electrolyte derangement, stable creatinine.  Patient troponin 4 and 5 respectively.  Patient chest x-ray shows no consolidations or effusions.  EKG is nonischemic.  Due to patient story and past medical history, did consult with Dr. Chancy Milroy of cardiology.   Dr. Chancy Milroy states that due to the patient having flat troponins, reassuring EKG and no active chest pain the patient can be discharged home and can follow-up with either her cardiologist in [place] or can be referred to cardiology here at [facility].  Discussed with patient who chooses to be referred to cardiology here in [place].  Will place ambulatory referral.  Patient has been given strict return precautions and she is voiced understanding.  The patient had all her questions answered to her satisfaction.  The patient is stable for discharge.   Final Clinical Impression(s) / ED Diagnoses Final diagnoses:  Chest pain, unspecified type    Rx / DC Orders ED  Discharge Orders          Ordered    Ambulatory referral to Cardiology        02/27/23 0008              Lawana Chambers 02/27/23 Belenda Cruise, MD 02/27/23 1859

## 2023-02-27 NOTE — Discharge Instructions (Signed)
Return to the ED with any new or worsening signs or symptoms such as chest pain, shortness of breath, abdominal pain, nausea or right arm pain Please await call from cardiology.  They will call you on Monday or Tuesday to set up appointment.  You will need echocardiogram, stress test. In the event that you wish to not follow-up with [facility] cardiology, please follow-up with your cardiologist in [place]

## 2024-01-06 ENCOUNTER — Telehealth: Payer: Self-pay | Admitting: *Deleted

## 2024-01-06 NOTE — Telephone Encounter (Signed)
S/w Jessica-referral coordinator at PCP office. In our conversation to help clarify notes that were sent to our office. Notes states pt is going to have her surgery in [place] where her daughter lives. Pt will need a new pt appt as she was last seen 2010 with Dr. Myrtis Ser. Shanda Bumps will d/w MD further if referral to cardiologist in [place] as notes are not clear is the pt moving to [place] or just having surgery and recovery in [place] and will still reside in [place]. Shanda Bumps will try and get clarification. I have asked her to let us know if we still need to get a new pt appt. Shanda Bumps said she will also have the surgeon office in [place] fax clearance request to our office.

## 2024-01-10 NOTE — Telephone Encounter (Signed)
Our office received notes from PCP, however these notes do not provide the needed information for the eye surgery the pt is supposed to be having in [place]. Our office will still need the clearance request from the surgeon's office in [place].   We will need:   PROCEDURE TO BE DONE TYPE OF ANESTHESIA TO BE USED BLOOD THINNERS NEEDING TO BE HELD NAME OF SURGEON PERFORMING THE PROCEDURE PH AND FAX # FOR THE SURGEON OFFICE  This information is needed from the surgeon's office before the pt can be cleared for her eye surgery.  Our fax# is [phone] attn: preop team

## 2024-01-13 NOTE — Telephone Encounter (Signed)
I s/w the pt to let her know that we still have not received any clearance request from [place] on her behalf. Pt said she is not needing a cardiac clearance. I then advise she s/w her PCP Dr. Dannielle Huh as they have referred her to cardiology. Pt said not she in not scheduling any cardiology appt.  I will update the PCP office as the notes came from PCP.

## 2024-02-01 NOTE — Telephone Encounter (Signed)
PCP sent back to our office the pt is being referred to cardiology not only for preop clearance but also for labile HTN and possible syncope per Carrie Mew, ANP.   I will send a message to our scheduling team to see if they can reach out to the pt with a NEW PT APPT FOR HTN , POSSIBLE SYNCOPE AND PREOP CLEARANCE.    Our office still has not received any type of clearance form for the pt. Pt states previously she will be having surgery in [place].

## 2024-02-07 NOTE — Telephone Encounter (Signed)
I reached out to our scheduling team about new pt appt, see previous notes. I know the scheduling team has reached out once to the pt, though left message to call back.

## 2024-02-10 NOTE — Telephone Encounter (Signed)
I will update the PCP office that our office has tried to reach out to the pt to schedule a new pt appt, but she has not returned our calls.
